# Patient Record
Sex: Male | Born: 1990 | Race: White | Hispanic: No | Marital: Single | Smoking: Former smoker
Health system: Southern US, Community
[De-identification: ages and names within clinical notes are randomized; demographics above are authoritative.]

## PROBLEM LIST (undated history)

## (undated) HISTORY — PX: CLAVICLE EXCISION: SHX597

## (undated) HISTORY — PX: EYE SURGERY: SHX253

## (undated) HISTORY — PX: KNEE RECONSTRUCTION: SHX5883

---
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
## 2012-02-24 ENCOUNTER — Encounter (HOSPITAL_COMMUNITY): Payer: Self-pay

## 2012-02-24 ENCOUNTER — Emergency Department (HOSPITAL_COMMUNITY): Payer: Self-pay

## 2012-02-24 ENCOUNTER — Emergency Department (HOSPITAL_COMMUNITY)
Admission: EM | Admit: 2012-02-24 | Discharge: 2012-02-24 | Disposition: A | Discharge status: Home / Self Care | Payer: Self-pay | Attending: Emergency Medicine | Admitting: Emergency Medicine

## 2012-02-24 DIAGNOSIS — R1032 Left lower quadrant pain: Secondary | ICD-10-CM | POA: Insufficient documentation

## 2012-02-24 DIAGNOSIS — F17200 Nicotine dependence, unspecified, uncomplicated: Secondary | ICD-10-CM | POA: Insufficient documentation

## 2012-02-24 DIAGNOSIS — R109 Unspecified abdominal pain: Secondary | ICD-10-CM

## 2012-02-24 LAB — URINALYSIS, ROUTINE W REFLEX MICROSCOPIC
Leukocytes, UA: NEGATIVE
Protein, ur: NEGATIVE mg/dL
Specific Gravity, Urine: >1.03 — ABNORMAL HIGH (ref 1.005–1.030)
pH: 5.5 (ref 5.0–8.0)

## 2012-02-24 LAB — COMPREHENSIVE METABOLIC PANEL
ALT: 23 U/L (ref 0–53)
Albumin: 4.3 g/dL (ref 3.5–5.2)
Alkaline Phosphatase: 70 U/L (ref 39–117)
BUN: 12 mg/dL (ref 6–23)
CO2: 27 mEq/L (ref 19–32)
Calcium: 9.8 mg/dL (ref 8.4–10.5)
GFR calc Af Amer: 87 mL/min — ABNORMAL LOW (ref >90)
Potassium: 3.4 mEq/L — ABNORMAL LOW (ref 3.5–5.1)
Total Bilirubin: 0.5 mg/dL (ref 0.3–1.2)

## 2012-02-24 LAB — CBC WITH DIFFERENTIAL/PLATELET
Eosinophils Relative: 2 % (ref 0–5)
HCT: 47 % (ref 39.0–52.0)
MCH: 30.3 pg (ref 26.0–34.0)
MCHC: 34.9 g/dL (ref 30.0–36.0)
Neutro Abs: 6.5 10*3/uL (ref 1.7–7.7)
Neutrophils Relative %: 66 % (ref 43–77)
RBC: 5.42 MIL/uL (ref 4.22–5.81)
WBC: 9.8 10*3/uL (ref 4.0–10.5)

## 2012-02-24 LAB — URINE MICROSCOPIC-ADD ON

## 2012-02-24 LAB — LIPASE, BLOOD: Lipase: 35 U/L (ref 11–59)

## 2012-02-24 MED ORDER — IOHEXOL 300 MG/ML  SOLN
100.0000 mL | Freq: Once | INTRAMUSCULAR | Status: AC | PRN
  Administered 2012-02-24: 100 mL via INTRAVENOUS

## 2012-02-24 MED ORDER — SODIUM CHLORIDE 0.9 % IV BOLUS (SEPSIS)
1000.0000 mL | Freq: Once | INTRAVENOUS | Status: AC
  Administered 2012-02-24: 1000 mL via INTRAVENOUS

## 2012-02-24 NOTE — ED Notes (Signed)
Patient transported to CT 

## 2012-02-24 NOTE — ED Provider Notes (Signed)
Care assumed from Dr. Bernette Mayers at shift change awaiting ct results.  The ct does not show any acute pathology.  The labs were reviewed and only show microscopic blood in the urine.  There is no fever or elevated wbc.  The patient was re-examined and he is no longer having any discomfort.  The abdominal exam is benign and he appears stable for discharge.  I answered questions and spoke with the patient and his mother and they understand that we will give this time, ibuprofen, and follow up next week if this does not improve.  He was also advised to increase the fiber in his diet.    Geoffery Lyons, MD 02/24/12 [phone]

## 2012-02-24 NOTE — ED Notes (Signed)
Left sided abdominal pain, has been hurting for a couple of months and it has got bad over the past couple of days. Having a lot of pain and was nauseated coming home from work today per pt. He did tell me that his urine has been dark per mother.

## 2012-02-24 NOTE — ED Notes (Signed)
Pt reports left lower quarant pain for a month, states has become worse over the last 3 days. Some nausae today from pain. Last bowel movement today & it was normal. Denies any urinary symptoms other than dark urine.

## 2012-02-24 NOTE — ED Notes (Signed)
Pt alert & oriented x4, stable gait. Patient given discharge instructions, paperwork & prescription(s). Patient  instructed to stop at the registration desk to finish any additional paperwork. Patient verbalized understanding. Pt left department w/ no further questions. 

## 2012-02-24 NOTE — ED Provider Notes (Signed)
History     CSN: [id number]  Arrival date & time 02/24/12  1946   First MD Initiated Contact with Patient 02/24/12 1959      Chief Complaint  Patient presents with  . Abdominal Pain    (Consider location/radiation/quality/duration/timing/severity/associated sxs/prior treatment) HPI Pt with no known medical problems reports intermittent sharp, LLQ abdominal pain off and on for about a month but worse in the last 2 days, more frequent and more severe, associated with nausea today and dark urine, but no vomiting, diarrhea, hematochezia or dysuria. No particular provoking or relieving factors.   History reviewed. No pertinent past medical history.  Past Surgical History  Procedure Date  . Eye surgery   . Clavicle excision   . Knee reconstruction     left knee    No family history on file.  History  Substance Use Topics  . Smoking status: Current Every Day Smoker -- 0.5 packs/day    Types: Cigarettes  . Smokeless tobacco: Not on file  . Alcohol Use: No      Review of Systems All other systems reviewed and are negative except as noted in HPI.   Allergies  Review of patient's allergies indicates no known allergies.  Home Medications  No current outpatient prescriptions on file.  BP 135/79  Pulse 88  Temp 98.9 F (37.2 C) (Oral)  Resp 20  Ht 6\' 4"  (1.93 m)  Wt 205 lb (92.987 kg)  BMI 24.95 kg/m2  SpO2 96%  Physical Exam  Nursing note and vitals reviewed. Constitutional: He is oriented to person, place, and time. He appears well-developed and well-nourished.  HENT:  Head: Normocephalic and atraumatic.  Eyes: EOM are normal. Pupils are equal, round, and reactive to light.  Neck: Normal range of motion. Neck supple.  Cardiovascular: Normal rate, normal heart sounds and intact distal pulses.   Pulmonary/Chest: Effort normal and breath sounds normal.  Abdominal: Bowel sounds are normal. He exhibits no distension. There is tenderness (LLQ). There is no rebound  and no guarding.  Musculoskeletal: Normal range of motion. He exhibits no edema and no tenderness.  Neurological: He is alert and oriented to person, place, and time. He has normal strength. No cranial nerve deficit or sensory deficit.  Skin: Skin is warm and dry. No rash noted.  Psychiatric: He has a normal mood and affect.    ED Course  Procedures (including critical care time)  Labs Reviewed  URINALYSIS, ROUTINE W REFLEX MICROSCOPIC - Abnormal; Notable for the following:    Specific Gravity, Urine >1.030 (*)     Hgb urine dipstick LARGE (*)     All other components within normal limits  COMPREHENSIVE METABOLIC PANEL - Abnormal; Notable for the following:    Potassium 3.4 (*)     Glucose, Bld 102 (*)     GFR calc non Af Amer 75 (*)     GFR calc Af Amer 87 (*)     All other components within normal limits  URINE MICROSCOPIC-ADD ON - Abnormal; Notable for the following:    Bacteria, UA FEW (*)     All other components within normal limits  CBC WITH DIFFERENTIAL  LIPASE, BLOOD   No results found.   No diagnosis found.    MDM  Labs as above with mild hematuria, otherwise neg. Pending CT, care signed out to Dr. Judd Lien.        Charles B. Bernette Mayers, MD 02/24/12 2101

## 2013-10-24 IMAGING — CT CT ABD-PELV W/ CM
2 of 3 series · 16 of 46 positions shown, 18 images · IV contrast (omnipaque)
Comparison: None.

CLINICAL DATA: Intermittent left lower quadrant abdominal pain
which has worsened today.  Nausea.  Dark urine.

CT ABDOMEN AND PELVIS WITH CONTRAST
TECHNIQUE: Multidetector CT imaging of the abdomen and pelvis was
performed following the standard protocol during bolus
administration of intravenous contrast.
Contrast: 100mL OMNIPAQUE IOHEXOL 300 MG/ML. Oral contrast was also
administered.

[Series 2: abd_pel_with 5.0 b40f · axial · 0.76mm/px · z∈[-468,-52]mm · 13 of 97 slices shown, 15 images]
[im 7/97  soft-tissue]
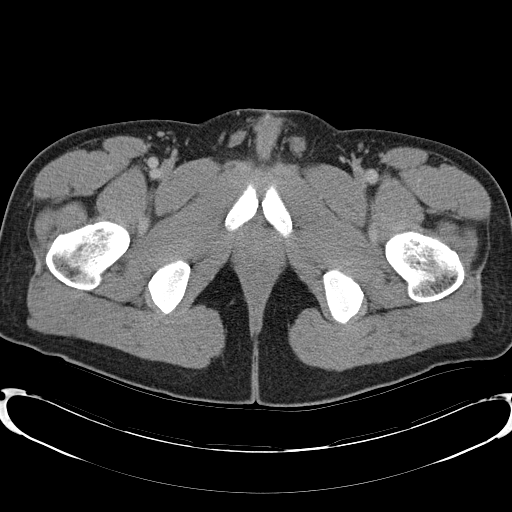
[im 7/97  bone]
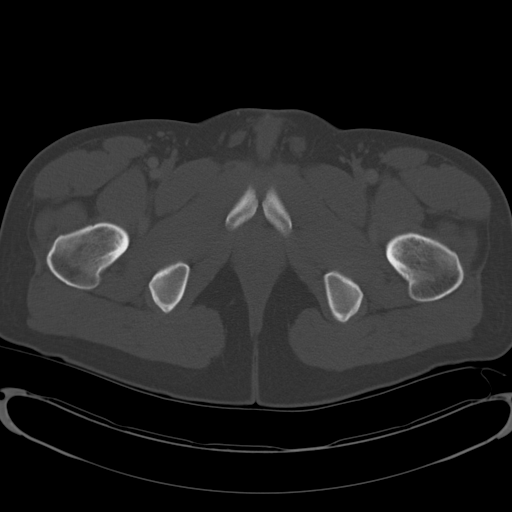
[im 13/97  soft-tissue]
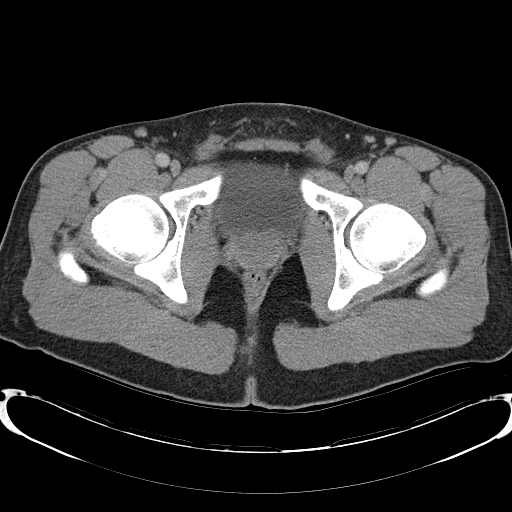
[im 19/97  soft-tissue]
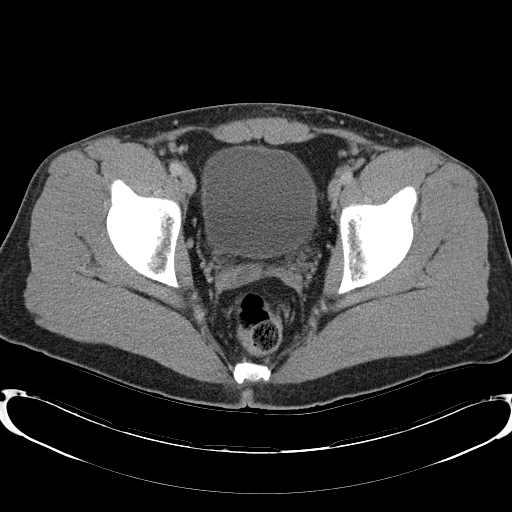
[im 28/97  soft-tissue]
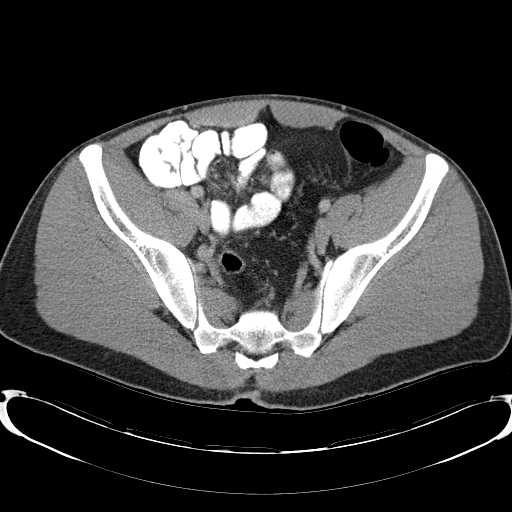
[im 35/97  soft-tissue]
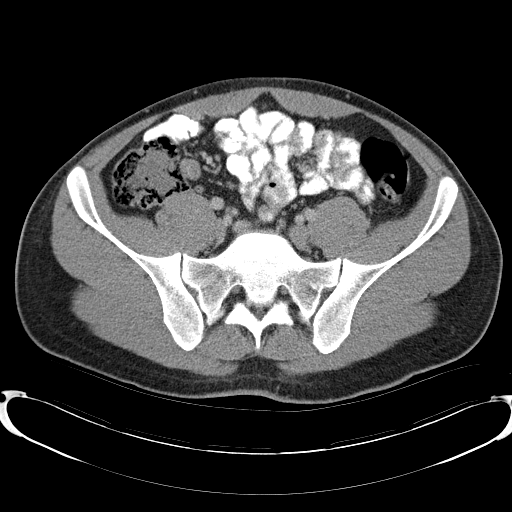
[im 41/97  soft-tissue]
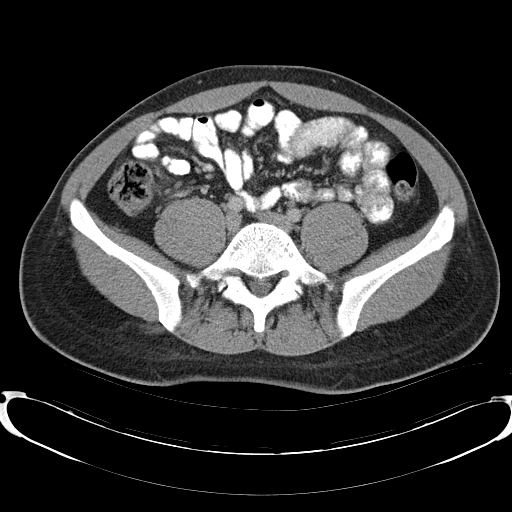
[im 50/97  soft-tissue]
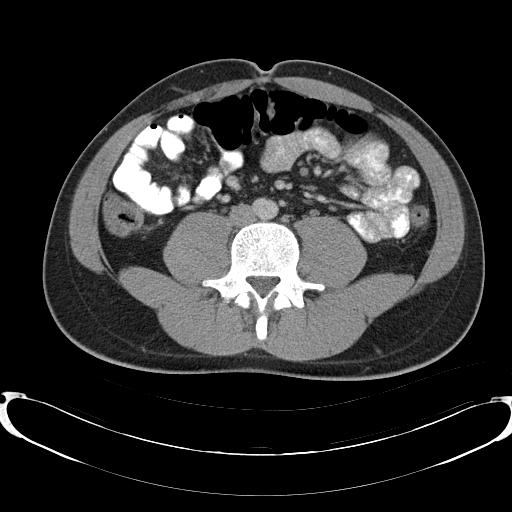
[im 56/97  soft-tissue]
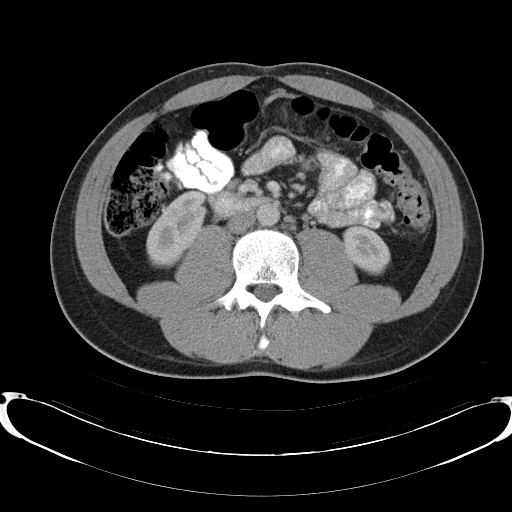
[im 62/97  soft-tissue]
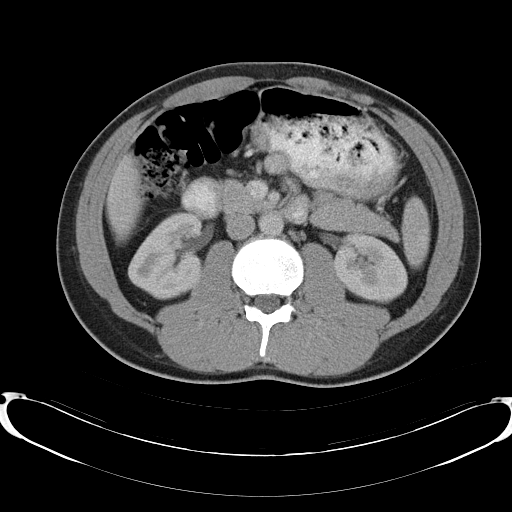
[im 62/97  bone]
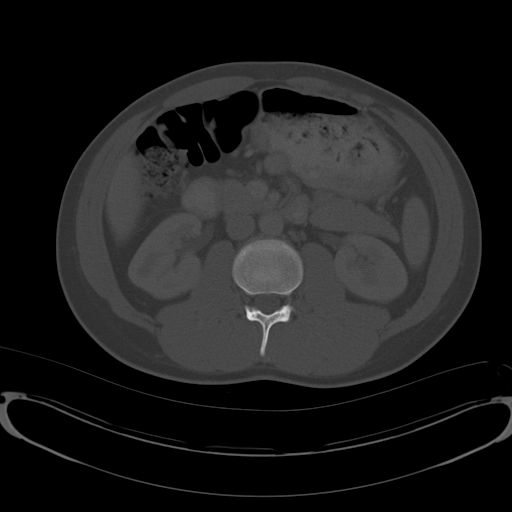
[im 69/97  soft-tissue]
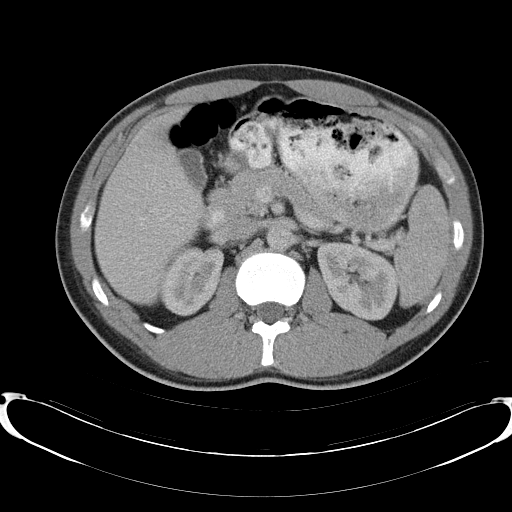
[im 78/97  soft-tissue]
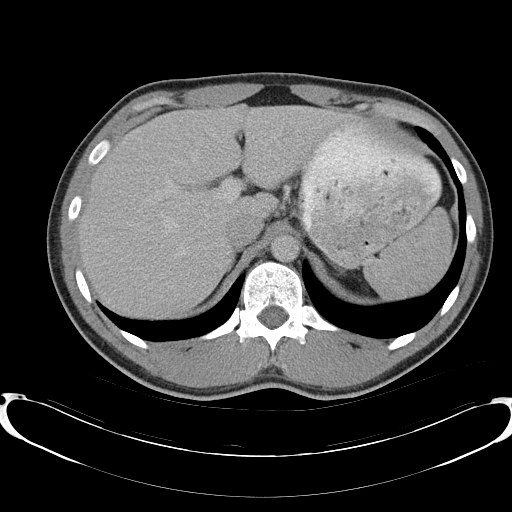
[im 84/97  soft-tissue]
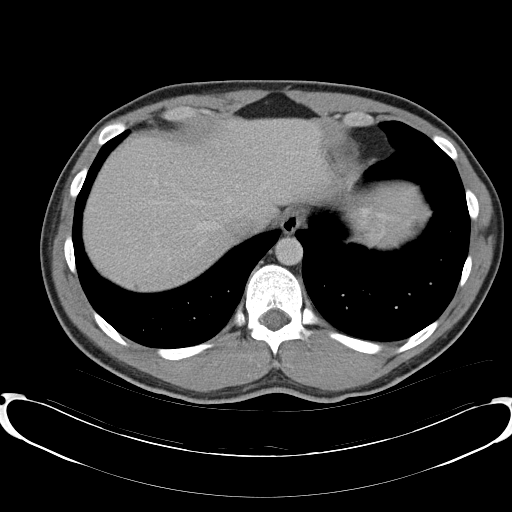
[im 90/97  soft-tissue]
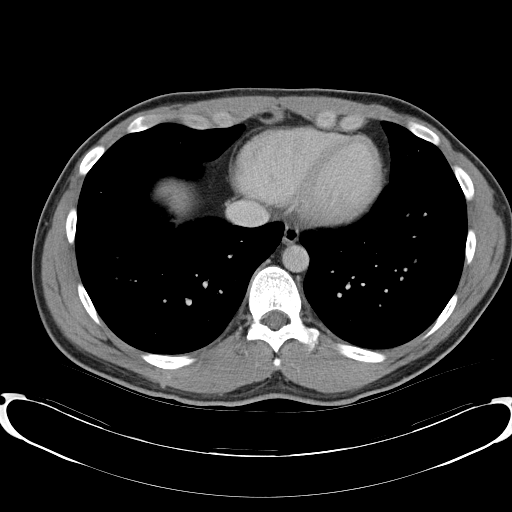

[Series 4: abd_pel_with 3.0 spo cor · coronal · 0.72mm/px · 3 of 80 slices shown]
[im 27/80  soft-tissue]
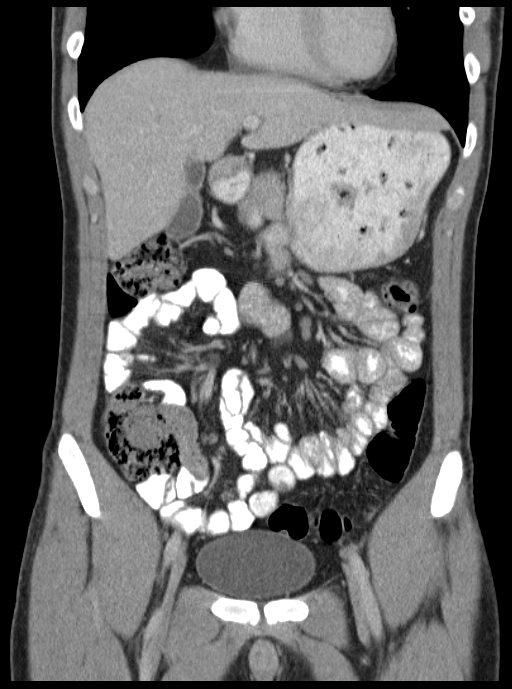
[im 36/80  soft-tissue]
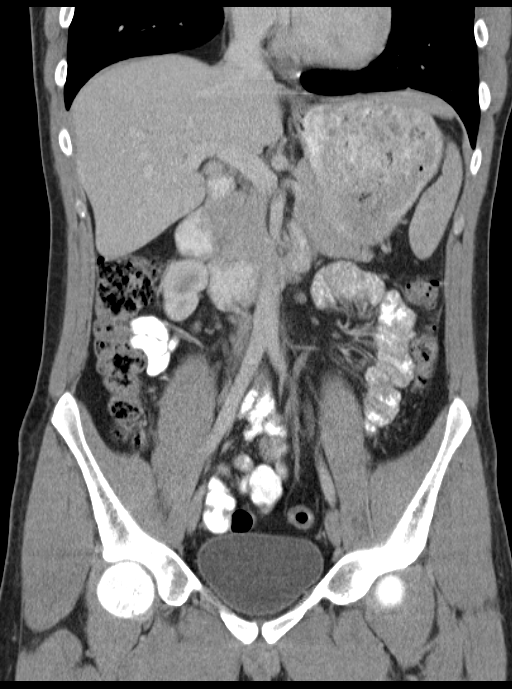
[im 44/80  soft-tissue]
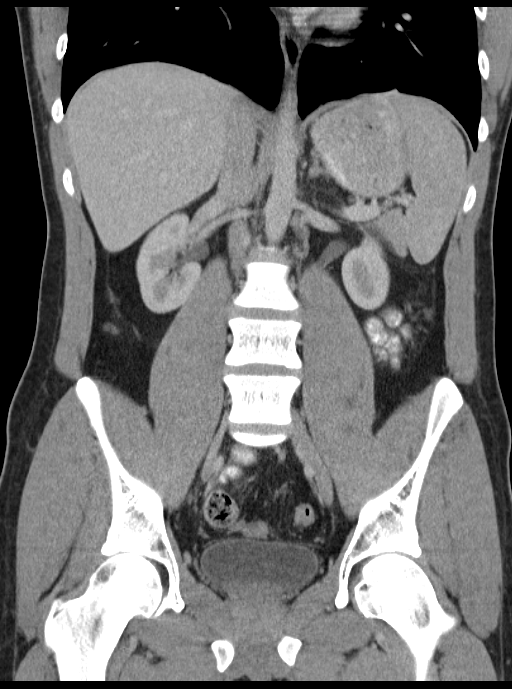

[16 of 46 positions shown; findings below may reference images not displayed]

FINDINGS: Large amount of food within the normal-appearing stomach
is mildly distended with the oral contrast.  Normal-appearing small
bowel.  Normal-appearing colon, with decompressed sigmoid colon and
proximal descending colon, accounting for apparent wall thickening.
Normal appendix in the right upper pelvis.  No ascites.

Several tiny (sub-7 mm) low attenuation foci in the liver,
statistically consistent with cysts, given their conspicuity for
their small size. Normal appearing spleen, pancreas, adrenal
glands, and kidneys.  Normal vascular structures.  No significant
lymphadenopathy.  Gallbladder unremarkable by CT.  No biliary
ductal dilation.

Urinary bladder unremarkable.  Seminal vesicle cysts.  Prostate
gland normal in size.

Visualized lung bases clear.  Schmorl's node in the upper endplate
of L3; bone window images otherwise unremarkable.
IMPRESSION: No acute or significant abnormalities involving the abdomen or
pelvis.

## 2021-05-06 ENCOUNTER — Ambulatory Visit
Admission: EM | Admit: 2021-05-06 | Discharge: 2021-05-06 | Disposition: A | Discharge status: Home / Self Care | Payer: Medicaid Other | Attending: Family Medicine | Admitting: Family Medicine

## 2021-05-06 ENCOUNTER — Other Ambulatory Visit: Payer: Self-pay

## 2021-05-06 DIAGNOSIS — J069 Acute upper respiratory infection, unspecified: Secondary | ICD-10-CM | POA: Diagnosis not present

## 2021-05-06 DIAGNOSIS — Z20828 Contact with and (suspected) exposure to other viral communicable diseases: Secondary | ICD-10-CM | POA: Diagnosis not present

## 2021-05-06 MED ORDER — PROMETHAZINE-DM 6.25-15 MG/5ML PO SYRP: 5.0000 mL | ORAL_SOLUTION | Freq: Four times a day (QID) | ORAL | 0 refills | Status: DC | PRN

## 2021-05-06 MED ORDER — OSELTAMIVIR PHOSPHATE 75 MG PO CAPS: 75.0000 mg | ORAL_CAPSULE | Freq: Two times a day (BID) | ORAL | 0 refills | Status: DC

## 2021-05-06 MED ORDER — FLUTICASONE PROPIONATE 50 MCG/ACT NA SUSP: 1.0000 | Freq: Two times a day (BID) | NASAL | 2 refills | Status: DC

## 2021-05-06 MED ORDER — ACETAMINOPHEN 325 MG PO TABS
650.0000 mg | ORAL_TABLET | Freq: Once | ORAL | Status: AC
  Administered 2021-05-06: 650 mg via ORAL

## 2021-05-06 NOTE — ED Provider Notes (Signed)
[facility]    CSN: [id number] Arrival date & time: 05/06/21  1516      History   Chief Complaint Chief Complaint  Patient presents with   Sore Throat    Sore throat, headache and cough    HPI Gilbert Meadows is a 31 y.o. male.   Patient presenting today with 2-day history of headache, nasal congestion, ear pain and pressure, cough, sore throat, generalized fatigue.  Denies chest pain, shortness of breath, abdominal pain, nausea vomiting or diarrhea.  No known sick contacts recently.  Taking Alka-Seltzer cold and flu with mild temporary relief of symptoms.  Home COVID test was negative.  No known pertinent chronic medical problems.  History reviewed. No pertinent past medical history.  There are no problems to display for this patient.  Past Surgical History:  Procedure Laterality Date   CLAVICLE EXCISION     EYE SURGERY     KNEE RECONSTRUCTION     left knee     Home Medications    Prior to Admission medications   Medication Sig Start Date End Date Taking? Authorizing Provider  fluticasone (FLONASE) 50 MCG/ACT nasal spray Place 1 spray into both nostrils 2 (two) times daily. 05/06/21  Yes Particia Nearing, PA-C  oseltamivir (TAMIFLU) 75 MG capsule Take 1 capsule (75 mg total) by mouth every 12 (twelve) hours. 05/06/21  Yes Particia Nearing, PA-C  promethazine-dextromethorphan (PROMETHAZINE-DM) 6.25-15 MG/5ML syrup Take 5 mLs by mouth 4 (four) times daily as needed. 05/06/21  Yes Particia Nearing, PA-C   Family History Family History  Problem Relation Age of Onset   High blood pressure Mother    Healthy Father    Social History Social History   Tobacco Use   Smoking status: Former    Packs/day: 0.50    Types: Cigarettes  Vaping Use   Vaping Use: Every day   Substances: Nicotine  Substance Use Topics   Alcohol use: No   Drug use: No   Allergies   Patient has no known allergies.   Review of Systems Review of Systems Per  HPI  Physical Exam Triage Vital Signs ED Triage Vitals  Enc Vitals Group     BP 05/06/21 1546 119/84     Pulse Rate 05/06/21 1546 92     Resp 05/06/21 1546 18     Temp 05/06/21 1546 99.8 F (37.7 C)     Temp Source 05/06/21 1546 Oral     SpO2 05/06/21 1546 96 %     Weight --      Height --      Head Circumference --      Peak Flow --      Pain Score 05/06/21 1542 8     Pain Loc --      Pain Edu? --      Excl. in GC? --    No data found.  Updated Vital Signs BP 119/84 (BP Location: Right Arm)    Pulse 92    Temp 99.8 F (37.7 C) (Oral)    Resp 18    SpO2 96%   Visual Acuity Right Eye Distance:   Left Eye Distance:   Bilateral Distance:    Right Eye Near:   Left Eye Near:    Bilateral Near:     Physical Exam Vitals and nursing note reviewed.  Constitutional:      Appearance: He is well-developed.  HENT:     Head: Atraumatic.     Right Ear:  External ear normal.     Left Ear: External ear normal.     Nose: Rhinorrhea present.     Mouth/Throat:     Pharynx: Posterior oropharyngeal erythema present. No oropharyngeal exudate.  Eyes:     Conjunctiva/sclera: Conjunctivae normal.     Pupils: Pupils are equal, round, and reactive to light.  Cardiovascular:     Rate and Rhythm: Normal rate and regular rhythm.     Heart sounds: Normal heart sounds.  Pulmonary:     Effort: Pulmonary effort is normal. No respiratory distress.     Breath sounds: No wheezing or rales.  Musculoskeletal:        General: Normal range of motion.     Cervical back: Normal range of motion and neck supple.  Lymphadenopathy:     Cervical: No cervical adenopathy.  Skin:    General: Skin is warm and dry.  Neurological:     Mental Status: He is alert and oriented to person, place, and time.     Motor: No weakness.     Gait: Gait normal.  Psychiatric:        Behavior: Behavior normal.     UC Treatments / Results  Labs (all labs ordered are listed, but only abnormal results are  displayed) Labs Reviewed  COVID-19, FLU A+B NAA    EKG   Radiology No results found.  Procedures Procedures (including critical care time)  Medications Ordered in UC Medications  acetaminophen (TYLENOL) tablet 650 mg (650 mg Oral Given 05/06/21 1553)    Initial Impression / Assessment and Plan / UC Course  I have reviewed the triage vital signs and the nursing notes.  Pertinent labs & imaging results that were available during my care of the patient were reviewed by me and considered in my medical decision making (see chart for details).     Vital signs overall reassuring, COVID and flu testing pending.  We will start Tamiflu while awaiting these results and adjust if needed based on these.  We will also start Flonase, Phenergan DM in addition to over-the-counter supportive medications and home care.  Work note given.  Return for acutely worsening symptoms.  Final Clinical Impressions(s) / UC Diagnoses   Final diagnoses:  Exposure to the flu  Viral URI with cough   Discharge Instructions   None    ED Prescriptions     Medication Sig Dispense Auth. Provider   oseltamivir (TAMIFLU) 75 MG capsule Take 1 capsule (75 mg total) by mouth every 12 (twelve) hours. 10 capsule Particia Nearing, PA-C   fluticasone [facility]) 50 MCG/ACT nasal spray Place 1 spray into both nostrils 2 (two) times daily. 16 g Roosvelt Maser [place], [place]   promethazine-dextromethorphan (PROMETHAZINE-DM) 6.25-15 MG/5ML syrup Take 5 mLs by mouth 4 (four) times daily as needed. 100 mL Particia Nearing, [place]      PDMP not reviewed this encounter.   Particia Nearing, [place] 05/06/21 1721

## 2021-05-06 NOTE — ED Triage Notes (Signed)
Patient states that on Monday he woke up with a headache that lasted all ady with taking Tylenol  Patient states that everything became worse Now his nose is stopped up, ears stopped up, cough and sore throat  Patient states he has been taking Alka Seltzer x2 daily,cough drops, and Tessalon Pearls.  Last dose of Alka Seltzer this morning  Denies Fever    Patient states he took an at home Covid test and it was negative

## 2021-05-07 ENCOUNTER — Other Ambulatory Visit: Payer: Self-pay | Admitting: Emergency Medicine

## 2021-05-07 LAB — COVID-19, FLU A+B NAA
Influenza A, NAA: NOT DETECTED
Influenza B, NAA: NOT DETECTED
SARS-CoV-2, NAA: DETECTED — AB

## 2021-05-07 MED ORDER — OSELTAMIVIR PHOSPHATE 75 MG PO CAPS: 75.0000 mg | ORAL_CAPSULE | Freq: Two times a day (BID) | ORAL | 0 refills | Status: DC

## 2021-05-07 MED ORDER — FLUTICASONE PROPIONATE 50 MCG/ACT NA SUSP: 1.0000 | Freq: Two times a day (BID) | NASAL | 2 refills | Status: DC

## 2021-05-07 MED ORDER — PROMETHAZINE-DM 6.25-15 MG/5ML PO SYRP: 5.0000 mL | ORAL_SOLUTION | Freq: Four times a day (QID) | ORAL | 0 refills | Status: DC | PRN

## 2021-10-21 ENCOUNTER — Ambulatory Visit
Admission: EM | Admit: 2021-10-21 | Discharge: 2021-10-21 | Disposition: A | Discharge status: Home / Self Care | Payer: Medicaid Other | Attending: Nurse Practitioner | Admitting: Nurse Practitioner

## 2021-10-21 ENCOUNTER — Encounter: Payer: Self-pay | Admitting: Emergency Medicine

## 2021-10-21 DIAGNOSIS — J029 Acute pharyngitis, unspecified: Secondary | ICD-10-CM | POA: Diagnosis present

## 2021-10-21 LAB — POCT MONO SCREEN (KUC): Mono, POC: NEGATIVE

## 2021-10-21 LAB — POCT RAPID STREP A (OFFICE): Rapid Strep A Screen: NEGATIVE

## 2021-10-21 MED ORDER — FLUTICASONE PROPIONATE 50 MCG/ACT NA SUSP: 1.0000 | Freq: Two times a day (BID) | NASAL | 2 refills | Status: DC

## 2021-10-21 NOTE — Discharge Instructions (Addendum)
Your symptoms and exam findings are most consistent with a viral sore throat. This usually runs its course in a few days.  If your symptoms last longer than 10 days without improvement, please follow up with your primary care provider.  If your symptoms, worsen, please go to the Emergency Room.    We have tested you today for strep throat and mononucleosis.  The rapid strep throat test was negative, and the throat culture is pending.  Mono test is negative.  You will see the results in Mychart and we will call you with positive results.     Some things that can make you feel better are: - Increased rest - Increasing fluid with water/sugar free electrolytes - Acetaminophen and ibuprofen as needed for fever/pain.  - Salt water gargling, chloraseptic spray and throat lozenges - OTC guaifenesin (Mucinex).  - Saline sinus flushes or a neti pot.  - Humidifying the air.

## 2021-10-21 NOTE — ED Provider Notes (Signed)
[facility]    CSN: [id number] Arrival date & time: 10/21/21  7829      History   Chief Complaint No chief complaint on file.   HPI Gilbert Meadows is a 31 y.o. male.   Patient presents today with 2 days of sore throat, woke up this morning and the throat pain was severe.  He also endorses a little bit of congestion/drainage and a slight cough, and headache.  He denies fevers, bodyaches, chills, wheezing, shortness of breath, chest pain or tightness, swollen glands, ear pain or pressure, nausea/vomiting, abdominal pain, diarrhea, decreased appetite, new rash, and fatigue.  Reports his daughter was sick last week with an ear infection and upper respiratory infection.  He has not taken anything for symptoms so far.    History reviewed. No pertinent past medical history.  There are no problems to display for this patient.   Past Surgical History:  Procedure Laterality Date   CLAVICLE EXCISION     EYE SURGERY     KNEE RECONSTRUCTION     left knee       Home Medications    Prior to Admission medications   Medication Sig Start Date End Date Taking? Authorizing Provider  fluticasone (FLONASE) 50 MCG/ACT nasal spray Place 1 spray into both nostrils 2 (two) times daily. 10/21/21   Valentino Nose, NP    Family History Family History  Problem Relation Age of Onset   High blood pressure Mother    Healthy Father     Social History Social History   Tobacco Use   Smoking status: Former    Packs/day: 0.50    Types: Cigarettes  Vaping Use   Vaping Use: Every day   Substances: Nicotine  Substance Use Topics   Alcohol use: No   Drug use: No     Allergies   Amoxil [amoxicillin]   Review of Systems Review of Systems Per HPI  Physical Exam Triage Vital Signs ED Triage Vitals [10/21/21 1043]  Enc Vitals Group     BP 130/84     Pulse Rate 88     Resp 18     Temp 98.7 F (37.1 C)     Temp Source Oral     SpO2 97 %     Weight      Height       Head Circumference      Peak Flow      Pain Score 6     Pain Loc      Pain Edu?      Excl. in GC?    No data found.  Updated Vital Signs BP 130/84 (BP Location: Right Arm)   Pulse 88   Temp 98.7 F (37.1 C) (Oral)   Resp 18   SpO2 97%   Visual Acuity Right Eye Distance:   Left Eye Distance:   Bilateral Distance:    Right Eye Near:   Left Eye Near:    Bilateral Near:     Physical Exam Vitals and nursing note reviewed.  Constitutional:      General: He is not in acute distress.    Appearance: He is well-developed. He is not ill-appearing, toxic-appearing or diaphoretic.  HENT:     Head: Normocephalic and atraumatic.     Right Ear: Tympanic membrane and ear canal normal. No drainage, swelling or tenderness. No middle ear effusion. Tympanic membrane is not erythematous.     Left Ear: Ear canal normal. No drainage, swelling or tenderness.  A middle ear effusion is present. Tympanic membrane is not erythematous.     Nose: No congestion or rhinorrhea.     Right Sinus: No maxillary sinus tenderness or frontal sinus tenderness.     Left Sinus: No maxillary sinus tenderness or frontal sinus tenderness.     Mouth/Throat:     Mouth: Mucous membranes are dry.     Pharynx: Posterior oropharyngeal erythema present. No uvula swelling.     Tonsils: No tonsillar exudate or tonsillar abscesses. 1+ on the right. 1+ on the left.  Eyes:     Conjunctiva/sclera: Conjunctivae normal.  Cardiovascular:     Rate and Rhythm: Normal rate and regular rhythm.  Pulmonary:     Effort: Pulmonary effort is normal. No respiratory distress.     Breath sounds: Normal breath sounds. No wheezing, rhonchi or rales.  Musculoskeletal:     Cervical back: Neck supple.  Lymphadenopathy:     Cervical: No cervical adenopathy.  Skin:    General: Skin is warm and dry.     Capillary Refill: Capillary refill takes less than 2 seconds.     Coloration: Skin is not pale.     Findings: No erythema or rash.   Neurological:     Mental Status: He is alert and oriented to person, place, and time.      UC Treatments / Results  Labs (all labs ordered are listed, but only abnormal results are displayed) Labs Reviewed  CULTURE, GROUP A STREP [facility])  POCT RAPID STREP A (OFFICE)  POCT MONO SCREEN [facility])    EKG   Radiology No results found.  Procedures Procedures (including critical care time)  Medications Ordered in UC Medications - No data to display  Initial Impression / Assessment and Plan / UC Course  I have reviewed the triage vital signs and the nursing notes.  Pertinent labs & imaging results that were available during my care of the patient were reviewed by me and considered in my medical decision making (see chart for details).    Patient is a very pleasant, well-appearing 31 year old male presenting today with acute pharyngitis.  Rapid strep throat test is negative, mononucleosis test is also negative.  Strep throat culture pending.  Encouraged use of Flonase to help with left middle ear effusion.  Encourage supportive care for sore throat.  Seek care if symptoms persist or worsen despite treatment.  Note given for work.  The patient was given the opportunity to ask questions.  All questions answered to their satisfaction.  The patient is in agreement to this plan.    Final Clinical Impressions(s) / UC Diagnoses   Final diagnoses:  Acute pharyngitis, unspecified etiology     Discharge Instructions      Your symptoms and exam findings are most consistent with a viral sore throat. This usually runs its course in a few days.  If your symptoms last longer than 10 days without improvement, please follow up with your primary care provider.  If your symptoms, worsen, please go to the Emergency Room.    We have tested you today for strep throat and mononucleosis.  The rapid strep throat test was negative, and the throat culture is pending.  Mono test is negative.  You will see  the results in Mychart and we will call you with positive results.     Some things that can make you feel better are: - Increased rest - Increasing fluid with water/sugar free electrolytes - Acetaminophen and ibuprofen as needed for fever/pain.  -  Salt water gargling, chloraseptic spray and throat lozenges - OTC guaifenesin (Mucinex).  - Saline sinus flushes or a neti pot.  - Humidifying the air.    ED Prescriptions     Medication Sig Dispense Auth. Provider   fluticasone (FLONASE) 50 MCG/ACT nasal spray Place 1 spray into both nostrils 2 (two) times daily. 16 g Valentino Nose, NP      PDMP not reviewed this encounter.   Valentino Nose, NP 10/21/21 1141

## 2021-10-21 NOTE — ED Triage Notes (Signed)
Sore throat since yesterday.  Pain worse today. C/o headache and cough

## 2021-10-23 ENCOUNTER — Telehealth (HOSPITAL_COMMUNITY): Payer: Self-pay | Admitting: Emergency Medicine

## 2021-10-23 LAB — CULTURE, GROUP A STREP (THRC)

## 2021-10-23 NOTE — Telephone Encounter (Signed)
Patient left a voicemail stating he was still having sore throat and was looking for the results of his strep culture.  Culture is still in process, which I updated him on.  We discussed his symptoms, he is complaining of some shortness of breath with exertion.  ED precautions reviewed, return precautions reviewed, encouraged patient to monitor through the weekend

## 2021-10-24 ENCOUNTER — Encounter: Payer: Self-pay | Admitting: Emergency Medicine

## 2021-10-24 ENCOUNTER — Ambulatory Visit
Admission: EM | Admit: 2021-10-24 | Discharge: 2021-10-24 | Disposition: A | Discharge status: Home / Self Care | Payer: Medicaid Other | Attending: Nurse Practitioner | Admitting: Nurse Practitioner

## 2021-10-24 DIAGNOSIS — J029 Acute pharyngitis, unspecified: Secondary | ICD-10-CM | POA: Diagnosis not present

## 2021-10-24 DIAGNOSIS — R051 Acute cough: Secondary | ICD-10-CM

## 2021-10-24 LAB — CULTURE, GROUP A STREP (THRC)

## 2021-10-24 MED ORDER — AZITHROMYCIN 250 MG PO TABS: 250.0000 mg | ORAL_TABLET | Freq: Every day | ORAL | 0 refills | Status: DC

## 2021-10-24 NOTE — ED Provider Notes (Signed)
[facility]    CSN: [id number] Arrival date & time: 10/24/21  0949      History   Chief Complaint No chief complaint on file.   HPI Gilbert Meadows is a 31 y.o. male.   The history is provided by the patient.   Patient presents for complaints of continued sore throat and cough.  Patient was seen on 10/21/2021 for the same or similar symptoms.  He reports increased pain and swelling in the throat along with a more persistent cough.  Patient denies any new symptoms to include fever, chills, nasal congestion, runny nose, headache, wheezing, shortness of breath, or GI symptoms.  Patient states he has been taking the medications that were previously prescribed to include fluticasone for a middle ear effusion of the left ear.  He also states he has been using cough drops and over-the-counter Chloraseptic throat spray.  History reviewed. No pertinent past medical history.  There are no problems to display for this patient.   Past Surgical History:  Procedure Laterality Date   CLAVICLE EXCISION     EYE SURGERY     KNEE RECONSTRUCTION     left knee       Home Medications    Prior to Admission medications   Medication Sig Start Date End Date Taking? Authorizing Provider  azithromycin (ZITHROMAX) 250 MG tablet Take 1 tablet (250 mg total) by mouth daily. Take first 2 tablets together, then 1 every day until finished. 10/24/21  Yes Leya Paige-Warren, Sadie Haber, NP  fluticasone (FLONASE) 50 MCG/ACT nasal spray Place 1 spray into both nostrils 2 (two) times daily. 10/21/21   Valentino Nose, NP    Family History Family History  Problem Relation Age of Onset   High blood pressure Mother    Healthy Father     Social History Social History   Tobacco Use   Smoking status: Former    Packs/day: 0.50    Types: Cigarettes  Vaping Use   Vaping Use: Every day   Substances: Nicotine  Substance Use Topics   Alcohol use: No   Drug use: No     Allergies   Amoxil  [amoxicillin]   Review of Systems Review of Systems Per HPI  Physical Exam Triage Vital Signs ED Triage Vitals  Enc Vitals Group     BP 10/24/21 1212 128/84     Pulse Rate 10/24/21 1212 84     Resp 10/24/21 1212 18     Temp 10/24/21 1212 98.9 F (37.2 C)     Temp Source 10/24/21 1212 Oral     SpO2 10/24/21 1212 96 %     Weight --      Height --      Head Circumference --      Peak Flow --      Pain Score 10/24/21 1214 6     Pain Loc --      Pain Edu? --      Excl. in [place]? --    No data found.  Updated Vital Signs BP 128/84 (BP Location: Right Arm)   Pulse 84   Temp 98.9 F (37.2 C) (Oral)   Resp 18   SpO2 96%   Visual Acuity Right Eye Distance:   Left Eye Distance:   Bilateral Distance:    Right Eye Near:   Left Eye Near:    Bilateral Near:     Physical Exam Vitals and nursing note reviewed.  Constitutional:      Appearance: He  is well-developed.  HENT:     Head: Normocephalic and atraumatic.     Right Ear: Tympanic membrane, ear canal and external ear normal.     Left Ear: Ear canal and external ear normal. A middle ear effusion is present.     Nose: Congestion present.     Right Turbinates: Enlarged and swollen.     Left Turbinates: Enlarged and swollen.     Right Sinus: No maxillary sinus tenderness or frontal sinus tenderness.     Left Sinus: No maxillary sinus tenderness or frontal sinus tenderness.     Mouth/Throat:     Mouth: Mucous membranes are moist.     Pharynx: Uvula midline. Pharyngeal swelling and posterior oropharyngeal erythema present. No oropharyngeal exudate or uvula swelling.     Tonsils: 1+ on the right. 1+ on the left.  Eyes:     Extraocular Movements: Extraocular movements intact.     Conjunctiva/sclera: Conjunctivae normal.     Pupils: Pupils are equal, round, and reactive to light.  Neck:     Thyroid: No thyromegaly.     Trachea: No tracheal deviation.  Cardiovascular:     Rate and Rhythm: Normal rate and regular rhythm.      Heart sounds: Normal heart sounds.  Pulmonary:     Effort: Pulmonary effort is normal.     Breath sounds: Normal breath sounds.  Abdominal:     General: Bowel sounds are normal. There is no distension.     Palpations: Abdomen is soft.     Tenderness: There is no abdominal tenderness.  Musculoskeletal:     Cervical back: Normal range of motion and neck supple.  Lymphadenopathy:     Cervical: No cervical adenopathy.  Skin:    General: Skin is warm and dry.  Neurological:     General: No focal deficit present.     Mental Status: He is alert and oriented to person, place, and time.  Psychiatric:        Mood and Affect: Mood normal.        Behavior: Behavior normal.        Thought Content: Thought content normal.        Judgment: Judgment normal.      UC Treatments / Results  Labs (all labs ordered are listed, but only abnormal results are displayed) Labs Reviewed - No data to display  EKG   Radiology No results found.  Procedures Procedures (including critical care time)  Medications Ordered in UC Medications - No data to display  Initial Impression / Assessment and Plan / UC Course  I have reviewed the triage vital signs and the nursing notes.  Pertinent labs & imaging results that were available during my care of the patient were reviewed by me and considered in my medical decision making (see chart for details).  Patient presents for continued sore throat and cough that has been present for the past 3 to 4 days.  Patient was seen on 10/21/2021 and was provided supportive care recommendations and fluticasone for a left middle ear effusion.  Rapid strep test, monotest, and throat culture were all negative.  On exam today, patient continues to have +1 tonsil swelling bilaterally with erythema present.  Discussion with patient regarding his symptoms.  Discussed viral etiology of his symptoms.  Because patient has tried over-the-counter medication, will prescribe a short  course of azithromycin for him.  Advised patient that if this medicine does not improve his symptoms, the origin of his throat symptoms  are most likely viral.  Additional supportive care recommendations were provided to the patient.  Patient advised to follow-up as needed. Final Clinical Impressions(s) / UC Diagnoses   Final diagnoses:  Acute pharyngitis, unspecified etiology  Acute cough     Discharge Instructions      Take medication as prescribed. Increase fluids and allow for plenty of rest. Recommend over-the-counter Tylenol or ibuprofen as needed for pain, fever, or general discomfort. Warm salt water gargles 3-4 times daily to help with throat pain or discomfort. Recommend a diet with soft foods to include soups, broths, puddings, yogurt, Jell-O's, or popsicles until symptoms improve. Follow-up if symptoms do not improve.      ED Prescriptions     Medication Sig Dispense Auth. Provider   azithromycin (ZITHROMAX) 250 MG tablet Take 1 tablet (250 mg total) by mouth daily. Take first 2 tablets together, then 1 every day until finished. 6 tablet Japhet Morgenthaler-Warren, Sadie Haber, NP      PDMP not reviewed this encounter.   Abran Cantor, NP 10/24/21 1315

## 2021-10-24 NOTE — ED Notes (Signed)
States left side of throat hurts worse.

## 2021-10-24 NOTE — ED Triage Notes (Signed)
Was seen on Wednesday.  States no improvement, throat still sore and cough is worse.  Throat culture was negative from last visit.

## 2021-10-24 NOTE — Discharge Instructions (Addendum)
Take medication as prescribed. Increase fluids and allow for plenty of rest. Recommend over-the-counter Tylenol or ibuprofen as needed for pain, fever, or general discomfort. Warm salt water gargles 3-4 times daily to help with throat pain or discomfort. Recommend a diet with soft foods to include soups, broths, puddings, yogurt, Jell-O's, or popsicles until symptoms improve. Follow-up if symptoms do not improve.

## 2022-03-23 ENCOUNTER — Ambulatory Visit
Admission: EM | Admit: 2022-03-23 | Discharge: 2022-03-23 | Disposition: A | Discharge status: Home / Self Care | Payer: Medicaid Other | Attending: Nurse Practitioner | Admitting: Nurse Practitioner

## 2022-03-23 ENCOUNTER — Other Ambulatory Visit: Payer: Self-pay

## 2022-03-23 ENCOUNTER — Encounter: Payer: Self-pay | Admitting: Emergency Medicine

## 2022-03-23 DIAGNOSIS — J00 Acute nasopharyngitis [common cold]: Secondary | ICD-10-CM | POA: Diagnosis not present

## 2022-03-23 DIAGNOSIS — Z28310 Unvaccinated for covid-19: Secondary | ICD-10-CM | POA: Insufficient documentation

## 2022-03-23 DIAGNOSIS — B349 Viral infection, unspecified: Secondary | ICD-10-CM | POA: Diagnosis present

## 2022-03-23 DIAGNOSIS — R058 Other specified cough: Secondary | ICD-10-CM | POA: Insufficient documentation

## 2022-03-23 DIAGNOSIS — Z1152 Encounter for screening for COVID-19: Secondary | ICD-10-CM | POA: Insufficient documentation

## 2022-03-23 LAB — RESP PANEL BY RT-PCR (FLU A&B, COVID) ARPGX2
Influenza A by PCR: NEGATIVE
Influenza B by PCR: NEGATIVE
SARS Coronavirus 2 by RT PCR: NEGATIVE

## 2022-03-23 LAB — POCT RAPID STREP A (OFFICE): Rapid Strep A Screen: NEGATIVE

## 2022-03-23 MED ORDER — PROMETHAZINE-DM 6.25-15 MG/5ML PO SYRP: 5.0000 mL | ORAL_SOLUTION | Freq: Four times a day (QID) | ORAL | 0 refills | Status: DC | PRN

## 2022-03-23 MED ORDER — FLUTICASONE PROPIONATE 50 MCG/ACT NA SUSP: 2.0000 | Freq: Every day | NASAL | 0 refills | Status: DC

## 2022-03-23 NOTE — ED Triage Notes (Signed)
Pt reports headache, bilateral ear pain, sore throat x2 days.

## 2022-03-23 NOTE — ED Provider Notes (Signed)
[facility]    CSN: [id number] Arrival date & time: 03/23/22  1287      History   Chief Complaint Chief Complaint  Patient presents with   Headache    HPI Gilbert Meadows is a 31 y.o. male.   The history is provided by the patient.   The patient presents with a 2 day history of headache, generalized bodyaches, bilateral ear pressure, and sore throat.  He also complains of nasal congestion and cough.  Patient denies fever, chills, wheezing, shortness of breath, difficulty breathing, or GI symptoms.  Patient states he has been taking over-the-counter cough and cold medication for his symptoms, but has not taken any today.  He denies any obvious known sick contacts.  Patient reports he has not been vaccinated for COVID or flu.  History reviewed. No pertinent past medical history.  There are no problems to display for this patient.   Past Surgical History:  Procedure Laterality Date   CLAVICLE EXCISION     EYE SURGERY     KNEE RECONSTRUCTION     left knee       Home Medications    Prior to Admission medications   Medication Sig Start Date End Date Taking? Authorizing Provider  fluticasone (FLONASE) 50 MCG/ACT nasal spray Place 2 sprays into both nostrils daily. 03/23/22  Yes Benjermin Korber-Warren, Sadie Haber, NP  promethazine-dextromethorphan (PROMETHAZINE-DM) 6.25-15 MG/5ML syrup Take 5 mLs by mouth 4 (four) times daily as needed for cough. 03/23/22  Yes Yomara Toothman-Warren, Sadie Haber, NP  azithromycin (ZITHROMAX) 250 MG tablet Take 1 tablet (250 mg total) by mouth daily. Take first 2 tablets together, then 1 every day until finished. 10/24/21   Aria Pickrell-Warren, Sadie Haber, NP    Family History Family History  Problem Relation Age of Onset   High blood pressure Mother    Healthy Father     Social History Social History   Tobacco Use   Smoking status: Former    Packs/day: 0.50    Types: Cigarettes  Vaping Use   Vaping Use: Every day   Substances: Nicotine   Substance Use Topics   Alcohol use: No   Drug use: No     Allergies   Amoxil [amoxicillin]   Review of Systems Review of Systems Per HPI  Physical Exam Triage Vital Signs ED Triage Vitals [03/23/22 0853]  Enc Vitals Group     BP 126/85     Pulse Rate 85     Resp 20     Temp 98.1 F (36.7 C)     Temp Source Oral     SpO2 95 %     Weight      Height      Head Circumference      Peak Flow      Pain Score 7     Pain Loc      Pain Edu?      Excl. in [place]?    No data found.  Updated Vital Signs BP 126/85 (BP Location: Right Arm)   Pulse 85   Temp 98.1 F (36.7 C) (Oral)   Resp 20   SpO2 95%   Visual Acuity Right Eye Distance:   Left Eye Distance:   Bilateral Distance:    Right Eye Near:   Left Eye Near:    Bilateral Near:     Physical Exam Vitals and nursing note reviewed.  Constitutional:      General: He is not in acute distress.    Appearance:  He is well-developed.  HENT:     Head: Normocephalic.     Right Ear: Ear canal and external ear normal.     Left Ear: Ear canal and external ear normal. A middle ear effusion is present.     Nose: Congestion present.     Right Turbinates: Enlarged and swollen.     Left Turbinates: Enlarged and swollen.     Right Sinus: No maxillary sinus tenderness or frontal sinus tenderness.     Left Sinus: No maxillary sinus tenderness or frontal sinus tenderness.     Mouth/Throat:     Mouth: Mucous membranes are moist.  Eyes:     Extraocular Movements: Extraocular movements intact.     Pupils: Pupils are equal, round, and reactive to light.  Cardiovascular:     Rate and Rhythm: Regular rhythm.     Heart sounds: Normal heart sounds.  Pulmonary:     Effort: Pulmonary effort is normal.     Breath sounds: Normal breath sounds.  Abdominal:     General: Bowel sounds are normal.     Palpations: Abdomen is soft.     Tenderness: There is no abdominal tenderness.  Musculoskeletal:     Cervical back: Normal range of  motion.  Lymphadenopathy:     Cervical: No cervical adenopathy.  Skin:    General: Skin is warm and dry.  Neurological:     General: No focal deficit present.     Mental Status: He is alert and oriented to person, place, and time.  Psychiatric:        Mood and Affect: Mood normal.        Behavior: Behavior normal.      UC Treatments / Results  Labs (all labs ordered are listed, but only abnormal results are displayed) Labs Reviewed  RESP PANEL BY RT-PCR (FLU A&B, COVID) ARPGX2  CULTURE, GROUP A STREP [facility])  POCT RAPID STREP A (OFFICE)    EKG   Radiology No results found.  Procedures Procedures (including critical care time)  Medications Ordered in UC Medications - No data to display  Initial Impression / Assessment and Plan / UC Course  I have reviewed the triage vital signs and the nursing notes.  Pertinent labs & imaging results that were available during my care of the patient were reviewed by me and considered in my medical decision making (see chart for details).  Patient is well-appearing, he is in no acute distress, vital signs are stable.  Rapid strep test is negative, throat culture and COVID/flu test are pending.  Will treat patient symptomatically for a viral illness.  Patient was prescribed fluticasone 50 mcg nasal spray and Promethazine DM for his cough.  Patient was given supportive care recommendations to include the use of Tylenol or ibuprofen, increasing fluids, and allowing for plenty of rest.  If the COVID test is positive, patient is a candidate to receive molnupiravir.  Patient verbalizes understanding.  All questions were answered.  Patient is stable for discharge.  Work note was provided. Final Clinical Impressions(s) / UC Diagnoses   Final diagnoses:  Viral illness     Discharge Instructions      The rapid strep test was negative.  A throat culture and COVID/flu test are pending.  If you choose, you are a candidate to receive molnupiravir  as an antiviral therapy if your COVID test is positive. Take medication as prescribed. Increase fluids and allow for plenty of rest. Recommend Tylenol or ibuprofen as needed for pain,  fever, or general discomfort. Warm salt water gargles 3-4 times daily to help with throat pain or discomfort. Recommend using a humidifier at bedtime during sleep to help with cough and nasal congestion. Sleep elevated on 2 pillows. Your symptoms are most likely caused by a viral infection.  You will need to the morning continue to treat your symptoms.  A viral infection can last from 10 to 14 days.  If you experience worsening symptoms or have other concerns, please follow-up in this clinic or with your primary care physician.     ED Prescriptions     Medication Sig Dispense Auth. Provider   promethazine-dextromethorphan (PROMETHAZINE-DM) 6.25-15 MG/5ML syrup Take 5 mLs by mouth 4 (four) times daily as needed for cough. 118 mL Lutricia Widjaja-Warren, Sadie Haber, NP   fluticasone (FLONASE) 50 MCG/ACT nasal spray Place 2 sprays into both nostrils daily. 16 g Destiny Hagin-Warren, Sadie Haber, NP      PDMP not reviewed this encounter.   Abran Cantor, NP 03/23/22 1002

## 2022-03-23 NOTE — Discharge Instructions (Addendum)
The rapid strep test was negative.  A throat culture and COVID/flu test are pending.  If you choose, you are a candidate to receive molnupiravir as an antiviral therapy if your COVID test is positive. Take medication as prescribed. Increase fluids and allow for plenty of rest. Recommend Tylenol or ibuprofen as needed for pain, fever, or general discomfort. Warm salt water gargles 3-4 times daily to help with throat pain or discomfort. Recommend using a humidifier at bedtime during sleep to help with cough and nasal congestion. Sleep elevated on 2 pillows. Your symptoms are most likely caused by a viral infection.  You will need to the morning continue to treat your symptoms.  A viral infection can last from 10 to 14 days.  If you experience worsening symptoms or have other concerns, please follow-up in this clinic or with your primary care physician.

## 2022-03-26 LAB — CULTURE, GROUP A STREP (THRC)

## 2022-06-25 ENCOUNTER — Ambulatory Visit
Admission: EM | Admit: 2022-06-25 | Discharge: 2022-06-25 | Disposition: A | Discharge status: Home / Self Care | Payer: Medicaid Other | Attending: Family Medicine | Admitting: Family Medicine

## 2022-06-25 ENCOUNTER — Encounter: Payer: Self-pay | Admitting: Emergency Medicine

## 2022-06-25 DIAGNOSIS — J101 Influenza due to other identified influenza virus with other respiratory manifestations: Secondary | ICD-10-CM | POA: Diagnosis not present

## 2022-06-25 LAB — POCT INFLUENZA A/B
Influenza A, POC: NEGATIVE
Influenza B, POC: POSITIVE — AB

## 2022-06-25 MED ORDER — OSELTAMIVIR PHOSPHATE 75 MG PO CAPS: 75.0000 mg | ORAL_CAPSULE | Freq: Two times a day (BID) | ORAL | 0 refills | Status: AC

## 2022-06-25 MED ORDER — BENZONATATE 100 MG PO CAPS: 100.0000 mg | ORAL_CAPSULE | Freq: Three times a day (TID) | ORAL | 0 refills | Status: AC | PRN

## 2022-06-25 NOTE — ED Triage Notes (Signed)
Fever, vomiting since Tuesday.  Chills, cough, cough productive at times, nausea at times.  At home covid test yesterday was negative.  Has been taking tylenol and motrin and alka selter for symptoms

## 2022-06-25 NOTE — ED Provider Notes (Signed)
[facility]    CSN: [id number] Arrival date & time: 06/25/22  [id number]      History   Chief Complaint No chief complaint on file.   HPI JUSTO MOWBRAY is a 32 y.o. male.   HPI  He is in today for fever, chills, nausea, productive cough. This has been going on for 3 days. Exposure positive COVID/Influenza/Strep. Denies  nasal congestion, sneezing, runny nose,  new sore throat, loss of smell or taste, shortness of breath, chest pain, or diarrhea. The current treatment has been OTC [employer], advertising and motrin.  History reviewed. No pertinent past medical history.  There are no problems to display for this patient.   Past Surgical History:  Procedure Laterality Date   CLAVICLE EXCISION     EYE SURGERY     KNEE RECONSTRUCTION     left knee       Home Medications    Prior to Admission medications   Medication Sig Start Date End Date Taking? Authorizing Provider  benzonatate (TESSALON) 100 MG capsule Take 1 capsule (100 mg total) by mouth 3 (three) times daily as needed for up to 10 days for cough. Never suck or chew on a benzonatate capsule. 06/25/22 07/05/22 Yes Vevelyn Francois, NP  oseltamivir (TAMIFLU) 75 MG capsule Take 1 capsule (75 mg total) by mouth every 12 (twelve) hours. 06/25/22  Yes Vevelyn Francois, NP    Family History Family History  Problem Relation Age of Onset   High blood pressure Mother    Healthy Father     Social History Social History   Tobacco Use   Smoking status: Former    Packs/day: 0.50    Types: Cigarettes  Vaping Use   Vaping Use: Every day   Substances: Nicotine  Substance Use Topics   Alcohol use: No   Drug use: No     Allergies   Amoxil [amoxicillin]   Review of Systems Review of Systems   Physical Exam Triage Vital Signs ED Triage Vitals  Enc Vitals Group     BP 06/25/22 1030 132/79     Pulse Rate 06/25/22 1030 94     Resp 06/25/22 1030 18     Temp 06/25/22 1030 99 F (37.2 C)     Temp Source 06/25/22  1030 Oral     SpO2 06/25/22 1030 98 %     Weight --      Height --      Head Circumference --      Peak Flow --      Pain Score 06/25/22 1031 7     Pain Loc --      Pain Edu? --      Excl. in [place]? --    No data found.  Updated Vital Signs BP 132/79 (BP Location: Right Arm)   Pulse 94   Temp 99 F (37.2 C) (Oral)   Resp 18   SpO2 98%   Visual Acuity Right Eye Distance:   Left Eye Distance:   Bilateral Distance:    Right Eye Near:   Left Eye Near:    Bilateral Near:     Physical Exam Constitutional:      Appearance: He is obese.     Comments: Clammy   HENT:     Head: Normocephalic and atraumatic.     Nose: Nose normal.     Mouth/Throat:     Mouth: Mucous membranes are moist.  Cardiovascular:     Rate and Rhythm: Normal  rate and regular rhythm.     Pulses: Normal pulses.     Heart sounds: Normal heart sounds.  Pulmonary:     Effort: Pulmonary effort is normal.     Breath sounds: Normal breath sounds.  Musculoskeletal:        General: Normal range of motion.     Cervical back: Normal range of motion.  Skin:    General: Skin is warm and dry.     Capillary Refill: Capillary refill takes less than 2 seconds.  Neurological:     General: No focal deficit present.     Mental Status: He is alert.  Psychiatric:        Mood and Affect: Mood normal.        Behavior: Behavior normal.      UC Treatments / Results  Labs (all labs ordered are listed, but only abnormal results are displayed) Labs Reviewed  POCT INFLUENZA A/B - Abnormal; Notable for the following components:      Result Value   Influenza B, POC Positive (*)    All other components within normal limits    EKG   Radiology No results found.  Procedures Procedures (including critical care time)  Medications Ordered in UC Medications - No data to display  Initial Impression / Assessment and Plan / UC Course  I have reviewed the triage vital signs and the nursing notes.  Pertinent labs &  imaging results that were available during my care of the patient were reviewed by me and considered in my medical decision making (see chart for details).     Cough  Final Clinical Impressions(s) / UC Diagnoses   Final diagnoses:  Influenza B     Discharge Instructions      Your Influenza is positive. Tamiflu has been prescribed take as directed We encourage conservative treatment with symptom relief. We encourage you to use Tylenol for your fever (Remember to use as directed do not exceed daily dosing recommendations). We also encourage salt water gargles for your sore throat. You should also consider throat lozenges and chloraseptic spray.  You can use cough medication over the counter. Honey forms will offer you the best relief.        ED Prescriptions     Medication Sig Dispense Auth. Provider   oseltamivir (TAMIFLU) 75 MG capsule Take 1 capsule (75 mg total) by mouth every 12 (twelve) hours. 10 capsule Vevelyn Francois, NP   benzonatate (TESSALON) 100 MG capsule Take 1 capsule (100 mg total) by mouth 3 (three) times daily as needed for up to 10 days for cough. Never suck or chew on a benzonatate capsule. 30 capsule Vevelyn Francois, NP      PDMP not reviewed this encounter.   Dionisio David [place], [place] 06/25/22 1130

## 2022-06-25 NOTE — Discharge Instructions (Addendum)
Your Influenza is positive. Tamiflu has been prescribed take as directed We encourage conservative treatment with symptom relief. We encourage you to use Tylenol for your fever (Remember to use as directed do not exceed daily dosing recommendations). We also encourage salt water gargles for your sore throat. You should also consider throat lozenges and chloraseptic spray.  You can use cough medication over the counter. Honey forms will offer you the best relief.

## 2023-08-22 ENCOUNTER — Ambulatory Visit
Admission: EM | Admit: 2023-08-22 | Discharge: 2023-08-22 | Disposition: A | Discharge status: Home / Self Care | Payer: Self-pay | Attending: Nurse Practitioner | Admitting: Nurse Practitioner

## 2023-08-22 DIAGNOSIS — J069 Acute upper respiratory infection, unspecified: Secondary | ICD-10-CM | POA: Insufficient documentation

## 2023-08-22 LAB — POC COVID19/FLU A&B COMBO
Covid Antigen, POC: NEGATIVE
Influenza A Antigen, POC: NEGATIVE
Influenza B Antigen, POC: NEGATIVE

## 2023-08-22 LAB — POCT RAPID STREP A (OFFICE): Rapid Strep A Screen: NEGATIVE

## 2023-08-22 MED ORDER — FLUTICASONE PROPIONATE 50 MCG/ACT NA SUSP: 2.0000 | Freq: Every day | NASAL | 0 refills | Status: AC

## 2023-08-22 MED ORDER — PSEUDOEPH-BROMPHEN-DM 30-2-10 MG/5ML PO SYRP: 5.0000 mL | ORAL_SOLUTION | Freq: Four times a day (QID) | ORAL | 0 refills | Status: AC | PRN

## 2023-08-22 NOTE — ED Triage Notes (Signed)
 Sinus pressure, sore throat, headache, congestion, cough x 3 days. Taking alka selter day time and tylenol .

## 2023-08-22 NOTE — Discharge Instructions (Signed)
 The COVID/flu test and rapid strep test were negative.  A throat culture is pending.  You will be contacted if the pending test results are abnormal.  You will also have access to results via MyChart. Take medication as prescribed.  Continue your current allergy medication daily. May take over-the-counter Tylenol  or ibuprofen as needed for pain, fever, or general discomfort. Warm salt water gargles 3-4 times daily as needed for throat pain or discomfort. Also recommend use of normal saline nasal spray for nasal congestion and runny nose. For your cough, recommend the use of a humidifier in your bedroom at nighttime to sleep consistent of late October while symptoms persist. As discussed, if symptoms do not improve over the next 7 to 10 days, or appear to be worsening, recommend following up in this clinic or with your primary care physician for further evaluation. Follow-up as needed.

## 2023-08-22 NOTE — ED Provider Notes (Signed)
 [facility]    CSN: [id number] Arrival date & time: 08/22/23  0831      History   Chief Complaint Chief Complaint  Patient presents with   Cough   Sore Throat    HPI INFANT Gilbert Meadows is a 33 y.o. male.   The history is provided by the patient.   Patient presents with a 3-day history of sinus pressure, sore throat, headache, nasal congestion, and cough.  Patient denies fever, chills, ear pain, ear drainage, wheezing, difficulty breathing, chest pain, abdominal pain, nausea, vomiting, diarrhea, or rash.  Patient reports he does have underlying history of seasonal allergies, states he has been taking Zyrtec daily.  Also reports taking Alka-Seltzer and Tylenol  for symptoms.  History reviewed. No pertinent past medical history.  There are no active problems to display for this patient.   Past Surgical History:  Procedure Laterality Date   CLAVICLE EXCISION     EYE SURGERY     KNEE RECONSTRUCTION     left knee       Home Medications    Prior to Admission medications   Medication Sig Start Date End Date Taking? Authorizing Provider  brompheniramine-pseudoephedrine-DM 30-2-10 MG/5ML syrup Take 5 mLs by mouth 4 (four) times daily as needed. 08/22/23  Yes Leath-Warren, Belen Bowers, NP  fluticasone  (FLONASE ) 50 MCG/ACT nasal spray Place 2 sprays into both nostrils daily. 08/22/23  Yes Leath-Warren, Belen Bowers, NP  oseltamivir  (TAMIFLU ) 75 MG capsule Take 1 capsule (75 mg total) by mouth every 12 (twelve) hours. 06/25/22   Gregoria Leas, NP    Family History Family History  Problem Relation Age of Onset   High blood pressure Mother    Healthy Father     Social History Social History   Tobacco Use   Smoking status: Former    Current packs/day: 0.50    Types: Cigarettes   Smokeless tobacco: Never  Vaping Use   Vaping status: Every Day   Substances: Nicotine  Substance Use Topics   Alcohol use: No   Drug use: No     Allergies   Amoxil  [amoxicillin]   Review of Systems Review of Systems Per HPI  Physical Exam Triage Vital Signs ED Triage Vitals [08/22/23 0901]  Encounter Vitals Group     BP 119/74     Systolic BP Percentile      Diastolic BP Percentile      Pulse Rate 83     Resp 16     Temp 98.6 F (37 C)     Temp Source Oral     SpO2 97 %     Weight      Height      Head Circumference      Peak Flow      Pain Score 6     Pain Loc      Pain Education      Exclude from Growth Chart    No data found.  Updated Vital Signs BP 119/74 (BP Location: Right Arm)   Pulse 83   Temp 98.6 F (37 C) (Oral)   Resp 16   SpO2 97%   Visual Acuity Right Eye Distance:   Left Eye Distance:   Bilateral Distance:    Right Eye Near:   Left Eye Near:    Bilateral Near:     Physical Exam Vitals and nursing note reviewed.  Constitutional:      General: He is not in acute distress.    Appearance: Normal  appearance.  HENT:     Head: Normocephalic.     Right Ear: Tympanic membrane, ear canal and external ear normal.     Left Ear: Tympanic membrane, ear canal and external ear normal.     Nose: Nose normal.     Right Turbinates: Enlarged and swollen.     Left Turbinates: Enlarged and swollen.     Right Sinus: No maxillary sinus tenderness or frontal sinus tenderness.     Left Sinus: No maxillary sinus tenderness or frontal sinus tenderness.     Mouth/Throat:     Lips: Pink.     Mouth: Mucous membranes are moist.     Pharynx: Oropharynx is clear. Uvula midline. Posterior oropharyngeal erythema and postnasal drip present. No pharyngeal swelling, oropharyngeal exudate or uvula swelling.     Comments: Cobblestoning present to posterior oropharynx  Eyes:     Extraocular Movements: Extraocular movements intact.     Conjunctiva/sclera: Conjunctivae normal.     Pupils: Pupils are equal, round, and reactive to light.  Cardiovascular:     Rate and Rhythm: Normal rate and regular rhythm.     Pulses: Normal pulses.      Heart sounds: Normal heart sounds.  Pulmonary:     Effort: Pulmonary effort is normal. No respiratory distress.     Breath sounds: Normal breath sounds. No stridor. No wheezing, rhonchi or rales.  Abdominal:     General: Bowel sounds are normal.     Palpations: Abdomen is soft.     Tenderness: There is no abdominal tenderness.  Musculoskeletal:     Cervical back: Normal range of motion.  Skin:    General: Skin is warm and dry.  Neurological:     General: No focal deficit present.     Mental Status: He is alert and oriented to person, place, and time.  Psychiatric:        Mood and Affect: Mood normal.        Behavior: Behavior normal.      UC Treatments / Results  Labs (all labs ordered are listed, but only abnormal results are displayed) Labs Reviewed  POC COVID19/FLU A&B COMBO - Normal  POCT RAPID STREP A (OFFICE) - Normal  CULTURE, GROUP A STREP [facility])    EKG   Radiology No results found.  Procedures Procedures (including critical care time)  Medications Ordered in UC Medications - No data to display  Initial Impression / Assessment and Plan / UC Course  I have reviewed the triage vital signs and the nursing notes.  Pertinent labs & imaging results that were available during my care of the patient were reviewed by me and considered in my medical decision making (see chart for details).  The rapid strep test and COVID/flu test were negative.  A throat culture is pending.  On exam, lung sounds are clear throughout, room air sats at 97%.  Patient is well-appearing, he has been afebrile since symptoms started.  Symptoms consistent with a viral URI with cough.  Will provide symptomatic treatment with Bromfed-DM for his cough, and fluticasone  50 mcg nasal spray for nasal congestion and runny nose.  Supportive care recommendations were provided and discussed with the patient to include over-the-counter analgesics, normal saline nasal spray, and use of a humidifier during  sleep.  Discussed indications regarding follow-up.  Patient was in agreement with this plan of care and verbalizes understanding.  All questions were answered.  Patient stable for discharge.   Final Clinical Impressions(s) / UC Diagnoses  Final diagnoses:  Viral URI with cough     Discharge Instructions      The COVID/flu test and rapid strep test were negative.  A throat culture is pending.  You will be contacted if the pending test results are abnormal.  You will also have access to results via MyChart. Take medication as prescribed.  Continue your current allergy medication daily. May take over-the-counter Tylenol  or ibuprofen as needed for pain, fever, or general discomfort. Warm salt water gargles 3-4 times daily as needed for throat pain or discomfort. Also recommend use of normal saline nasal spray for nasal congestion and runny nose. For your cough, recommend the use of a humidifier in your bedroom at nighttime to sleep consistent of late October while symptoms persist. As discussed, if symptoms do not improve over the next 7 to 10 days, or appear to be worsening, recommend following up in this clinic or with your primary care physician for further evaluation. Follow-up as needed.     ED Prescriptions     Medication Sig Dispense Auth. Provider   fluticasone  (FLONASE ) 50 MCG/ACT nasal spray Place 2 sprays into both nostrils daily. 16 g Leath-Warren, Belen Bowers, NP   brompheniramine-pseudoephedrine-DM 30-2-10 MG/5ML syrup Take 5 mLs by mouth 4 (four) times daily as needed. 140 mL Leath-Warren, Belen Bowers, NP      PDMP not reviewed this encounter.   Hardy Lia, NP 08/22/23 [phone]

## 2023-08-25 LAB — CULTURE, GROUP A STREP (THRC)

## 2023-11-04 ENCOUNTER — Encounter: Payer: Self-pay | Admitting: Advanced Practice Midwife
